# Patient Record
Sex: Male | Born: 1995 | Race: Black or African American | Hispanic: No | Marital: Single | Smoking: Never smoker
Health system: Southern US, Community
[De-identification: ages and names within clinical notes are randomized; demographics above are authoritative.]

## PROBLEM LIST (undated history)

## (undated) DIAGNOSIS — K219 Gastro-esophageal reflux disease without esophagitis: Secondary | ICD-10-CM

## (undated) DIAGNOSIS — F419 Anxiety disorder, unspecified: Secondary | ICD-10-CM

## (undated) HISTORY — DX: Anxiety disorder, unspecified: F41.9

## (undated) HISTORY — PX: OTHER SURGICAL HISTORY: SHX169

## (undated) HISTORY — DX: Gastro-esophageal reflux disease without esophagitis: K21.9

---

## 2018-09-03 ENCOUNTER — Encounter: Payer: Self-pay | Admitting: General Surgery

## 2018-09-07 ENCOUNTER — Encounter: Payer: Self-pay | Admitting: Gastroenterology

## 2018-09-07 ENCOUNTER — Other Ambulatory Visit: Payer: Self-pay

## 2018-09-07 ENCOUNTER — Ambulatory Visit (INDEPENDENT_AMBULATORY_CARE_PROVIDER_SITE_OTHER): Payer: Self-pay | Admitting: Gastroenterology

## 2018-09-07 VITALS — Ht 75.0 in | Wt 250.0 lb

## 2018-09-07 DIAGNOSIS — K219 Gastro-esophageal reflux disease without esophagitis: Secondary | ICD-10-CM

## 2018-09-07 DIAGNOSIS — R1033 Periumbilical pain: Secondary | ICD-10-CM

## 2018-09-07 NOTE — Progress Notes (Signed)
Virtual Visit via Video Note  I connected with Bernard Arnold on 09/07/18 at  2:30 PM EDT by a video enabled telemedicine application and verified that I am speaking with the correct person using two identifiers.  I discussed the limitations of evaluation and management by telemedicine and the availability of in person appointments. The patient expressed understanding and agreed to proceed.  THIS ENCOUNTER IS A VIRTUAL VISIT DUE TO COVID-19 - PATIENT WAS NOT SEEN IN THE OFFICE. PATIENT HAS CONSENTED TO VIRTUAL VISIT / TELEMEDICINE VISIT USING FACETIME APP   Location of patient: home Location of provider: office Name of referring provider: self referred Persons participating: myself, patient   HPI :  23 y/o male with a history of anxiety and GERD, here for a new patient visit for abdominal pain.  He reports abdominal pain going on for roughly 1-2 weeks. He feels it around the umbilicus but just above it. He will feel it when he lies down and stretches his abdomen, it will reliably produce some discomfort there. If he is not stretching his abdomen or moving, he will not feel it. He does not notice it at all during the day, able to work and function without experiencing it. He doesn't think he pulled it nor aggravated it by lifting something or working. He won't feel it if not stretching. He is eating okay. No nausea or vomiting. Eating doesn't make this worse at all. He denies any constipation, does have occasional loose stools. He denies any blood in the stools.   No prior surgeries. No FH of gastric cancer or colon cancer.  He does Vape. He drinks alcohol about 5 days per week, he thinks 1-2 drinks at a time.Marland Kitchen.  He takes protonix for heartburn / reflux, works well, 20mg  once daily, he denies any breakthrough. No dysphagia. He has a good appetite, no weight loss.   He lives in [place], has had some labs done there by his primary care recently, states they were normal but do  not have any records available.  Past Medical History:  Diagnosis Date  . Anxiety   . GERD (gastroesophageal reflux disease)      History reviewed. No pertinent surgical history. Family History  Problem Relation Age of Onset  . Breast cancer Mother    Social History   Tobacco Use  . Smoking status: Never Smoker  . Smokeless tobacco: Never Used  Substance Use Topics  . Alcohol use: Yes    Alcohol/week: 2.0 standard drinks    Types: 2 Cans of beer per week    Comment: 5 days a week  . Drug use: Not Currently   Current Outpatient Medications  Medication Sig Dispense Refill  . pantoprazole (PROTONIX) 20 MG tablet Take 20 mg by mouth daily.     No current facility-administered medications for this visit.    No Known Allergies   Review of Systems: All systems reviewed and negative except where noted in HPI.    No results found.  Physical Exam: Ht 6\' 3"  (1.905 m) Comment: pt provided over the phone  Wt 250 lb (113.4 kg) Comment: pt provided over the phone  BMI 31.25 kg/m   NA   ASSESSMENT AND PLAN: 23 y/o male here for a new patient assessment of the following:  Abdominal pain - this is positional, only brought on by stretching his abdomen, otherwise does not bother him or limit his functioning, and has only been going on 1-2 weeks. I explained this visit is  somewhat limited in that I cannot examine him, but based on his history this seems most likely to be musculoskeletal, based on how he is describing his symptoms right now. He can try some ibuprofen to use PRN and see if that helps. I suspect this may get better with time, and he should avoid doing abdominal exercises right now. If symptoms persist moving forward or bothers him outside of stretching, etc, he should contact me for further assessment. He agreed.  GERD - low dose protonix working well for him as needed, he can continue this regimen and follow up PRN. No alarm symptoms.   Ileene Patrick, MD [facility]  Gastroenterology

## 2018-09-07 NOTE — Progress Notes (Signed)
Pt prescreened by Tracy Amaral for 5-4 appt 

## 2019-03-14 ENCOUNTER — Other Ambulatory Visit: Payer: Self-pay

## 2019-03-14 ENCOUNTER — Encounter (HOSPITAL_COMMUNITY): Payer: Self-pay

## 2019-03-14 ENCOUNTER — Emergency Department (HOSPITAL_COMMUNITY)
Admission: EM | Admit: 2019-03-14 | Discharge: 2019-03-14 | Disposition: A | Discharge status: Home / Self Care | Payer: BC Managed Care – PPO | Attending: Emergency Medicine | Admitting: Emergency Medicine

## 2019-03-14 DIAGNOSIS — Z20828 Contact with and (suspected) exposure to other viral communicable diseases: Secondary | ICD-10-CM | POA: Diagnosis not present

## 2019-03-14 DIAGNOSIS — R509 Fever, unspecified: Secondary | ICD-10-CM | POA: Diagnosis present

## 2019-03-14 DIAGNOSIS — Z79899 Other long term (current) drug therapy: Secondary | ICD-10-CM | POA: Diagnosis not present

## 2019-03-14 DIAGNOSIS — Z20822 Contact with and (suspected) exposure to covid-19: Secondary | ICD-10-CM

## 2019-03-14 LAB — SARS CORONAVIRUS 2 (TAT 6-24 HRS): SARS Coronavirus 2: NEGATIVE

## 2019-03-14 NOTE — ED Triage Notes (Signed)
Patient complains of fever last night with reported nausea, no other associated symptoms. Requesting covid test

## 2019-03-14 NOTE — ED Notes (Signed)
Patient verbalizes understanding of discharge instructions . Opportunity for questions and answers were provided . Armband removed by staff ,Pt discharged from ED. W/C  offered at D/C  and Declined W/C at D/C and was escorted to lobby by RN.  

## 2019-03-14 NOTE — Discharge Instructions (Signed)
Thank you for allowing [place] to care for you today.   Please return to the emergency department if you have any new or worsening symptoms.  You were tested today for coronavirus.  The test result should be available in 24 hours.  If it is positive you will receive a phone call.  If the result is negative you will be able to see it in your MyChart.   If positive please follow the instructions below:  Medications- You can take medications to help treat your symptoms: -Tylenol for fever and body aches. Please take as prescribed on the bottle. -Over the coutner cough medicine such as mucinex, robitussin, or other brands.  Treatment- This is a virus and unfortunately there are no antibitotics approved to treat this virus at this time. It is important to monitor your symptoms closely: -You should have a theremometer at home to check your temperature when feeling feverish. -Use a pulse ox meter to measure your oxygen when feeling short of breath.  -If your fever is over 100.4 despite taking tylenol or if your oxygen level drops below 94% these are reasons to rturn to the emergency department for further evaluation. Please call the emergency department before you come to make [place] aware.    We recommend you self-isolate for 10 days and to inform your work/family/friends that you has the virus.  They will need to self-quarantine for 14 days to monitor for symptoms.    Again: symptoms of shortnessf breath, chest pain, difficulty breathing, new onset of confuison, any symptoms that are concerning. And you or the person should come to emergency department for evaluation.   I hope you feel better soon

## 2019-03-14 NOTE — ED Provider Notes (Signed)
[facility] EMERGENCY DEPARTMENT Provider Note   CSN: [id number] Arrival date & time: 03/14/19  1541     History   Chief Complaint Chief Complaint  Patient presents with  . Fever/ nausea    HPI Bernard Arnold is a 23 y.o. male with past medical history significant for anxiety and GERD presents to emergency room today with chief complaint of requesting Covid test.  Patient states he was at work yesterday when he said he did not feel well and his boss that he needed to get tested.  He said he checked his temperature yesterday and it was 99.0.  He had an episode of nausea without emesis.  He denies any other symptoms.  He was recently in [place].  He states he was not on the "party scene." and did his best to wash his hands and wear his mask. He has not taken any medications for symptoms prior to arrival.  He denies cough, congestion, sore throat, chest pain, shortness of breath, abdominal pain, nausea, vomiting, urinary frequency, diarrhea.    Past Medical History:  Diagnosis Date  . Anxiety   . GERD (gastroesophageal reflux disease)     There are no active problems to display for this patient.   History reviewed. No pertinent surgical history.      Home Medications    Prior to Admission medications   Medication Sig Start Date End Date Taking? Authorizing Provider  pantoprazole (PROTONIX) 20 MG tablet Take 20 mg by mouth daily.    [provider]    Family History Family History  Problem Relation Age of Onset  . Breast cancer Mother     Social History Social History   Tobacco Use  . Smoking status: Never Smoker  . Smokeless tobacco: Never Used  Substance Use Topics  . Alcohol use: Yes    Alcohol/week: 2.0 standard drinks    Types: 2 Cans of beer per week    Comment: 5 days a week  . Drug use: Not Currently     Allergies   Patient has no known allergies.   Review of Systems Review of Systems  Constitutional: Positive for  fever.  Gastrointestinal: Positive for nausea.  All other systems reviewed and are negative.    Physical Exam Updated Vital Signs BP (!) 131/94 (BP Location: Left Arm)   Pulse 89   Temp 98.5 F (36.9 C) (Oral)   Resp 16   SpO2 99%    Physical Exam Vitals signs and nursing note reviewed.  Constitutional:      Appearance: He is well-developed. He is not ill-appearing or toxic-appearing.  HENT:     Head: Normocephalic and atraumatic.     Nose: Nose normal.  Eyes:     General: No scleral icterus.       Right eye: No discharge.        Left eye: No discharge.     Conjunctiva/sclera: Conjunctivae normal.  Neck:     Musculoskeletal: Normal range of motion.     Vascular: No JVD.  Cardiovascular:     Rate and Rhythm: Normal rate and regular rhythm.     Pulses: Normal pulses.     Heart sounds: Normal heart sounds.  Pulmonary:     Effort: Pulmonary effort is normal.     Breath sounds: Normal breath sounds.     Comments: Lungs are clear to auscultation all fields.  Normal work of breathing.  No wheezing, rhonchi or rales heard. Abdominal:  General: Bowel sounds are normal. There is no distension.     Palpations: Abdomen is soft.     Tenderness: There is no right CVA tenderness or left CVA tenderness.     Comments: Abdomen is soft and nontender.  No peritoneal signs.  No guarding or rigidity.   Musculoskeletal: Normal range of motion.  Skin:    General: Skin is warm and dry.  Neurological:     Mental Status: He is oriented to person, place, and time.     GCS: GCS eye subscore is 4. GCS verbal subscore is 5. GCS motor subscore is 6.     Comments: Fluent speech, no facial droop.  Psychiatric:        Behavior: Behavior normal.      ED Treatments / Results  Labs (all labs ordered are listed, but only abnormal results are displayed) Labs Reviewed  SARS CORONAVIRUS 2 (TAT 6-24 HRS)    EKG None  Radiology No results found.  Procedures Procedures (including critical  care time)  Medications Ordered in ED Medications - No data to display   Initial Impression / Assessment and Plan / ED Course  I have reviewed the triage vital signs and the nursing notes.  Pertinent labs & imaging results that were available during my care of the patient were reviewed by me and considered in my medical decision making (see chart for details).    I have reviewed patient's EMR to obtain pertinent PMH to assist in MDM.  Patient is well-appearing, no acute distress.  Vitals are within normal limits.  Symptoms and exam most suggestive of uncomplicated viral illness. DDX incluldes viral URI/LRI, COVID-19.  No travel. No known exposures to confirmed COVID-19.   Exam is benign.  Normal WOB. No fever, tachypnea, tachycardia, hypoxemia. Lungs are CTAB. I do not think that a CXR is indicated at this time as VS are WNL, there are no signs of consolidation on auscultation and there is no hypoxia, increased WOB or other concerning features to exam. No significant h/o immunocompromise. Doubt bacterial bronchitis or pneumonia.  No signs or symptoms to suggest strep pharyngitis.  No clinical signs of severe illness, dehydration, to warrant further emergent work up in ER.  Given reassuring physical exam, symptoms, will discharge with symptomatic treatment. Send out Covid test performed. Self-isolation instructions discussed. Pt was given home self-isolation instructions and instructions for family members.  Pt understands signs and symptoms that would warrant return to ED.  Pt comfortable and agreeable with POC.   Bernard Arnold was evaluated in Emergency Department on 03/14/2019 for the symptoms described in the history of present illness. He was evaluated in the context of the global COVID-19 pandemic, which necessitated consideration that the patient might be at risk for infection with the SARS-CoV-2 virus that causes COVID-19. Institutional protocols and algorithms that pertain to the  evaluation of patients at risk for COVID-19 are in a state of rapid change based on information released by regulatory bodies including the CDC and federal and state organizations. These policies and algorithms were followed during the patient's care in the ED.   Final Clinical Impressions(s) / ED Diagnoses   Final diagnoses:  Exposure to COVID-19 virus    ED Discharge Orders    None       Cherre Robins, PA-C 03/14/19 1933    Sherwood Gambler, MD 03/14/19 2344

## 2019-08-30 ENCOUNTER — Other Ambulatory Visit: Payer: Self-pay

## 2019-08-30 ENCOUNTER — Telehealth: Payer: Self-pay | Admitting: Gastroenterology

## 2019-08-30 MED ORDER — PANTOPRAZOLE SODIUM 20 MG PO TBEC: 20.0000 mg | DELAYED_RELEASE_TABLET | Freq: Every day | ORAL | 2 refills | Status: AC

## 2019-08-30 NOTE — Telephone Encounter (Signed)
Thank you Spoke with the patient. He would prefer to have his own prescription of Protonix. Agrees to give this a couple of weeks. He will call with an update. He will call sooner if he acutely worsens with the understanding he will need to be seen in office.

## 2019-08-30 NOTE — Telephone Encounter (Signed)
Thanks [employer]. I agree he should resume PPI. It can take a day for him to notice the difference after starting it. If he has omeprazole 40mg  / day he can use that for the next few weeks. If he wants to resume the protonix he has otherwise he can take that, or we can refill it for him, whichever his preference. If symptoms not improved on daily PPI after a few weeks he can call back. We can make him a routine office follow up for this if he wishes as well.

## 2019-08-30 NOTE — Telephone Encounter (Signed)
Patient calls with complaints of reflux and acid stomach for "a while now." He has tried extra strength TUMS for his symptoms with limited relief. His symptoms start in the mornings. Careful with diet as much as he can. Recently moved to [place] from [place]. Previously on Protonix 20mg . He has not taken that is at least 2 months. He took his grandmother's Omeprazole 40 mg today. He is not sure if it is helping. No different right now. Last office visit was virtual 09/07/2018. Please advise. Thanks.

## 2019-09-30 ENCOUNTER — Ambulatory Visit (INDEPENDENT_AMBULATORY_CARE_PROVIDER_SITE_OTHER): Payer: BC Managed Care – PPO | Admitting: Gastroenterology

## 2019-09-30 ENCOUNTER — Encounter: Payer: Self-pay | Admitting: Gastroenterology

## 2019-09-30 ENCOUNTER — Other Ambulatory Visit (INDEPENDENT_AMBULATORY_CARE_PROVIDER_SITE_OTHER): Payer: BC Managed Care – PPO

## 2019-09-30 VITALS — BP 104/78 | HR 84 | Ht 75.0 in | Wt 245.8 lb

## 2019-09-30 DIAGNOSIS — R1013 Epigastric pain: Secondary | ICD-10-CM

## 2019-09-30 DIAGNOSIS — K219 Gastro-esophageal reflux disease without esophagitis: Secondary | ICD-10-CM | POA: Diagnosis not present

## 2019-09-30 LAB — H. PYLORI ANTIBODY, IGG: H Pylori IgG: NEGATIVE

## 2019-09-30 NOTE — Progress Notes (Signed)
HPI :  24 year old male here for a follow-up visit for GERD and and epigastric discomfort.  He has had a history of reflux in the past, has used Protonix in the past as well as omeprazole and states it typically helps if he takes it.  He inquires and has questions about his reflux.  He does have waterbrash, has acid taste in the mouth when he wakes up.  He has occasional pyrosis but not too much.  He does have epigastric discomfort that comes and goes.  Denies routine postprandial symptoms, perhaps at times.  His stomach bothers him periodically most days of the week.  He denies any nausea or vomiting.  He is really well.  No weight loss.  No regurgitation.  He has some belching occasionally.  No significant or routine dysphagia.  His bowels are regular for the most part.  No blood in his stools.  He ran out of PPI and we gave him a trial of Protonix and states it does help when he takes it but he does not take it every day.  He denies any NSAIDs.  When he takes Tums it can help some of his symptoms as well.  He has questions about long-term management of this.  No FH of gastric cancer or colon cancer.    Past Medical History:  Diagnosis Date  . Anxiety   . GERD (gastroesophageal reflux disease)      Past Surgical History:  Procedure Laterality Date  . none     Family History  Problem Relation Age of Onset  . Breast cancer Mother   . Colon cancer Neg Hx   . Esophageal cancer Neg Hx    Social History   Tobacco Use  . Smoking status: Never Smoker  . Smokeless tobacco: Never Used  Substance Use Topics  . Alcohol use: Yes    Comment: 2-3 pints liquior per week   . Drug use: Not Currently   Current Outpatient Medications  Medication Sig Dispense Refill  . pantoprazole (PROTONIX) 20 MG tablet Take 1 tablet (20 mg total) by mouth daily before breakfast. 30 tablet 2   No current facility-administered medications for this visit.   No Known Allergies   Review of Systems: All  systems reviewed and negative except where noted in HPI.     Physical Exam: BP 104/78   Pulse 84   Ht 6\' 3"  (1.905 m)   Wt 245 lb 12.8 oz (111.5 kg)   BMI 30.72 kg/m  Constitutional: Pleasant,well-developed, male in no acute distress. Abdominal: Soft, nondistended, nontender. There are no masses palpable. No hepatomegaly. Extremities: no edema Lymphadenopathy: No cervical adenopathy noted. Neurological: Alert and oriented to person place and time. Skin: Skin is warm and dry. No rashes noted. Psychiatric: Normal mood and affect. Behavior is normal.   ASSESSMENT AND PLAN: 24 year old male here for reassessment of the following issues:  GERD / epigastric discomfort - he does have some reliable improvement with antacids, I think GERD, with possible mild esophagitis is the likely cause, although dyspepsia also possible vs. PUD / gastritis.  I will screen for H. pylori with serology to ensure negative, but otherwise discussed reflux and treatment options.  Has not been taking PPI routinely and has questions about this.  I recommend taking 20 mg Protonix once to twice daily every day for the next 4 to 6 weeks to try to get good control of symptoms and he can wean down to take as needed. He should take it  30 min prior to a meal. If this works well for him, I do not think we need to pursue any further evaluation.  If he takes this course and notices no improvement, asked him to call me back and we can discuss other options.  He agreed with the plan, if H. pylori serology is positive we will plan on treating him for that.   Ileene Patrick, MD [facility] Gastroenterology

## 2019-09-30 NOTE — Patient Instructions (Signed)
Your provider has requested that you go to the basement level for lab work before leaving today. Press "B" on the elevator. The lab is located at the first door on the left as you exit the elevator.  We have sent the following medications to your pharmacy for you to pick up at your convenience: Pantoprazole 20 mg- Take 1-2 tablets daily x 4-6 weeks, then take as needed.  If you are no better after taking pantoprazole as directed, please call our office at [phone].  If you are age 70 or older, your body mass index should be between 23-30. Your Body mass index is 30.72 kg/m. If this is out of the aforementioned range listed, please consider follow up with your Primary Care Provider.  If you are age 26 or younger, your body mass index should be between 19-25. Your Body mass index is 30.72 kg/m. If this is out of the aformentioned range listed, please consider follow up with your Primary Care Provider.   Due to recent changes in healthcare laws, you may see the results of your imaging and laboratory studies on MyChart before your provider has had a chance to review them.  We understand that in some cases there may be results that are confusing or concerning to you. Not all laboratory results come back in the same time frame and the provider may be waiting for multiple results in order to interpret others.  Please give [place] 48 hours in order for your provider to thoroughly review all the results before contacting the office for clarification of your results.

## 2019-11-21 ENCOUNTER — Other Ambulatory Visit: Payer: Self-pay

## 2019-11-21 ENCOUNTER — Encounter (HOSPITAL_COMMUNITY): Payer: Self-pay | Admitting: Emergency Medicine

## 2019-11-21 ENCOUNTER — Emergency Department (HOSPITAL_COMMUNITY): Payer: BC Managed Care – PPO

## 2019-11-21 ENCOUNTER — Emergency Department (HOSPITAL_COMMUNITY)
Admission: EM | Admit: 2019-11-21 | Discharge: 2019-11-21 | Disposition: A | Discharge status: Home / Self Care | Payer: BC Managed Care – PPO | Attending: Emergency Medicine | Admitting: Emergency Medicine

## 2019-11-21 DIAGNOSIS — R0789 Other chest pain: Secondary | ICD-10-CM | POA: Insufficient documentation

## 2019-11-21 LAB — BASIC METABOLIC PANEL
Anion gap: 10 (ref 5–15)
BUN: 5 mg/dL — ABNORMAL LOW (ref 6–20)
CO2: 25 mmol/L (ref 22–32)
Calcium: 9.2 mg/dL (ref 8.9–10.3)
Chloride: 105 mmol/L (ref 98–111)
Creatinine, Ser: 0.79 mg/dL (ref 0.61–1.24)
GFR calc Af Amer: >60 mL/min (ref >60)
GFR calc non Af Amer: >60 mL/min (ref >60)
Glucose, Bld: 116 mg/dL — ABNORMAL HIGH (ref 70–99)
Potassium: 4 mmol/L (ref 3.5–5.1)
Sodium: 140 mmol/L (ref 135–145)

## 2019-11-21 LAB — CBC
HCT: 46 % (ref 39.0–52.0)
Hemoglobin: 15.3 g/dL (ref 13.0–17.0)
MCH: 29 pg (ref 26.0–34.0)
MCHC: 33.3 g/dL (ref 30.0–36.0)
MCV: 87.3 fL (ref 80.0–100.0)
Platelets: 223 10*3/uL (ref 150–400)
RBC: 5.27 MIL/uL (ref 4.22–5.81)
RDW: 12.7 % (ref 11.5–15.5)
WBC: 6.2 10*3/uL (ref 4.0–10.5)
nRBC: 0 % (ref 0.0–0.2)

## 2019-11-21 LAB — TROPONIN I (HIGH SENSITIVITY)
Troponin I (High Sensitivity): 3 ng/L (ref <18)
Troponin I (High Sensitivity): <2 ng/L (ref <18)

## 2019-11-21 MED ORDER — SODIUM CHLORIDE 0.9% FLUSH: 3.0000 mL | Freq: Once | INTRAVENOUS | Status: DC

## 2019-11-21 MED ORDER — KETOROLAC TROMETHAMINE 60 MG/2ML IM SOLN
60.0000 mg | Freq: Once | INTRAMUSCULAR | Status: AC
  Administered 2019-11-21: 60 mg via INTRAMUSCULAR
  Filled 2019-11-21: qty 2

## 2019-11-21 NOTE — ED Triage Notes (Signed)
C/o R sided chest pain since 3am.  States pain is worse when he sits up from lying position.  Denies SOB, nausea, and vomiting.

## 2019-11-21 NOTE — Discharge Instructions (Addendum)
Ibuprofen 600 mg 3 times daily for the next 5 days.  Rest.  Follow-up with your primary doctor if not improving in the next week, and return to the ER if you develop worsening pain, high fever, difficulty breathing, or other new and concerning symptoms.

## 2019-11-21 NOTE — ED Provider Notes (Signed)
[facility] EMERGENCY DEPARTMENT Provider Note   CSN: [id number] Arrival date & time: 11/21/19  1533     History Chief Complaint  Patient presents with  . Chest Pain    [facility] is a 24 y.o. male.  Patient is a 24 year old male with history of anxiety and GERD.  He presents today for evaluation of chest discomfort.  Sleep at 3 AM today and noticed that this feeling was there.  He describes a sharp pain to the upper sternum that is worse when he lies flat, changes position, and breathes deeply.  He denies any shortness of breath, nausea, or diaphoresis.  Patient has no prior cardiac history and has no cardiac risk factors.  He denies any recent exertional symptoms.  The history is provided by the patient.  Chest Pain Pain location:  Substernal area Pain quality: sharp   Pain radiates to:  Does not radiate Pain severity:  Moderate Onset quality:  Sudden Duration:  12 hours Timing:  Constant Progression:  Unchanged Chronicity:  New Context: breathing, movement and raising an arm   Relieved by:  Nothing Ineffective treatments:  None tried      Past Medical History:  Diagnosis Date  . Anxiety   . GERD (gastroesophageal reflux disease)     There are no problems to display for this patient.   Past Surgical History:  Procedure Laterality Date  . none         Family History  Problem Relation Age of Onset  . Breast cancer Mother   . Colon cancer Neg Hx   . Esophageal cancer Neg Hx     Social History   Tobacco Use  . Smoking status: Never Smoker  . Smokeless tobacco: Never Used  Vaping Use  . Vaping Use: Some days  Substance Use Topics  . Alcohol use: Yes    Comment: 2-3 pints liquior per week   . Drug use: Not Currently    Home Medications Prior to Admission medications   Medication Sig Start Date End Date Taking? Authorizing Provider  pantoprazole (PROTONIX) 20 MG tablet Take 1 tablet (20 mg total) by mouth daily before  breakfast. 08/30/19   Armbruster, Willaim Rayas, MD    Allergies    Patient has no known allergies.  Review of Systems   Review of Systems  Cardiovascular: Positive for chest pain.  All other systems reviewed and are negative.   Physical Exam Updated Vital Signs BP (!) 132/95   Pulse 76   Temp 99 F (37.2 C) (Oral)   Resp 18   Ht 6\' 3"  (1.905 m)   Wt 113.4 kg   SpO2 100%   BMI 31.25 kg/m   Physical Exam Vitals and nursing note reviewed.  Constitutional:      General: He is not in acute distress.    Appearance: He is well-developed. He is not diaphoretic.  HENT:     Head: Normocephalic and atraumatic.  Cardiovascular:     Rate and Rhythm: Normal rate and regular rhythm.     Heart sounds: No murmur heard.  No friction rub.  Pulmonary:     Effort: Pulmonary effort is normal. No respiratory distress.     Breath sounds: Normal breath sounds. No wheezing or rales.  Abdominal:     General: Bowel sounds are normal. There is no distension.     Palpations: Abdomen is soft.     Tenderness: There is no abdominal tenderness.  Musculoskeletal:  General: Normal range of motion.     Cervical back: Normal range of motion and neck supple.     Right lower leg: No tenderness. No edema.     Left lower leg: No tenderness. No edema.  Skin:    General: Skin is warm and dry.  Neurological:     Mental Status: He is alert and oriented to person, place, and time.     Coordination: Coordination normal.     ED Results / Procedures / Treatments   Labs (all labs ordered are listed, but only abnormal results are displayed) Labs Reviewed  BASIC METABOLIC PANEL - Abnormal; Notable for the following components:      Result Value   Glucose, Bld 116 (*)    BUN 5 (*)    All other components within normal limits  CBC  TROPONIN I (HIGH SENSITIVITY)  TROPONIN I (HIGH SENSITIVITY)    EKG EKG Interpretation  Date/Time:  Sunday November 21 2019 15:56:46 EDT Ventricular Rate:  85 PR  Interval:  154 QRS Duration: 88 QT Interval:  364 QTC Calculation: 433 R Axis:   87 Text Interpretation: Normal sinus rhythm Normal ECG Confirmed by Geoffery Lyons ([id number]) on 11/21/2019 7:45:33 PM   Radiology DG Chest 2 View  Result Date: 11/21/2019 CLINICAL DATA:  C/o R sided chest pain since 3am. States pain is worse when he sits up from lying position. Denies SOB, nausea, and vomiting. EXAM: CHEST - 2 VIEW COMPARISON:  None. FINDINGS: The heart size and mediastinal contours are within normal limits. The lungs are clear. No pneumothorax or pleural effusion. The visualized skeletal structures are unremarkable. IMPRESSION: No active cardiopulmonary disease. Electronically Signed   By: Emmaline Kluver M.D.   On: 11/21/2019 16:38    Procedures Procedures (including critical care time)  Medications Ordered in ED Medications  sodium chloride flush (NS) 0.9 % injection 3 mL (has no administration in time range)  ketorolac (TORADOL) injection 60 mg (has no administration in time range)    ED Course  I have reviewed the triage vital signs and the nursing notes.  Pertinent labs & imaging results that were available during my care of the patient were reviewed by me and considered in my medical decision making (see chart for details).    MDM Rules/Calculators/A&P  Patient presenting here with complaints of pain to the upper chest that was present when he woke at 3 AM.  His pain is worse with palpation, movement, and raising his arms.  Patient's vital signs are stable and his work-up is unremarkable.  His EKG is normal and troponin is negative.  Chest x-ray is clear.  There is no hypoxia and no leg swelling or tenderness.  I feel as though the possibility of a cardiac etiology is exceedingly small.  I also highly doubt an emergent cause such as pulmonary embolism or aortic dissection.    His symptoms and exam are most consistent with a musculoskeletal etiology.  He will be given IM Toradol  and discharged with a regular dose of anti-inflammatories for the next 3 days.  He is to return if he worsens and follow-up if not improving.  Final Clinical Impression(s) / ED Diagnoses Final diagnoses:  None    Rx / DC Orders ED Discharge Orders    None       Geoffery Lyons, MD 11/21/19 2009

## 2020-10-24 ENCOUNTER — Encounter (HOSPITAL_COMMUNITY): Payer: Self-pay | Admitting: Emergency Medicine

## 2020-10-24 ENCOUNTER — Ambulatory Visit (HOSPITAL_COMMUNITY)
Admission: EM | Admit: 2020-10-24 | Discharge: 2020-10-24 | Disposition: A | Discharge status: Home / Self Care | Payer: No Typology Code available for payment source | Attending: Urgent Care | Admitting: Urgent Care

## 2020-10-24 ENCOUNTER — Other Ambulatory Visit: Payer: Self-pay

## 2020-10-24 DIAGNOSIS — Z20822 Contact with and (suspected) exposure to covid-19: Secondary | ICD-10-CM | POA: Insufficient documentation

## 2020-10-24 DIAGNOSIS — J069 Acute upper respiratory infection, unspecified: Secondary | ICD-10-CM | POA: Diagnosis not present

## 2020-10-24 DIAGNOSIS — J029 Acute pharyngitis, unspecified: Secondary | ICD-10-CM

## 2020-10-24 DIAGNOSIS — F17290 Nicotine dependence, other tobacco product, uncomplicated: Secondary | ICD-10-CM | POA: Diagnosis not present

## 2020-10-24 DIAGNOSIS — R059 Cough, unspecified: Secondary | ICD-10-CM | POA: Insufficient documentation

## 2020-10-24 LAB — POCT RAPID STREP A, ED / UC: Streptococcus, Group A Screen (Direct): NEGATIVE

## 2020-10-24 LAB — SARS CORONAVIRUS 2 (TAT 6-24 HRS): SARS Coronavirus 2: NEGATIVE

## 2020-10-24 MED ORDER — PROMETHAZINE-DM 6.25-15 MG/5ML PO SYRP
5.0000 mL | ORAL_SOLUTION | Freq: Every evening | ORAL | 0 refills | Status: AC | PRN
Start: 2020-10-24 — End: ?

## 2020-10-24 MED ORDER — BENZONATATE 100 MG PO CAPS: 100.0000 mg | ORAL_CAPSULE | Freq: Three times a day (TID) | ORAL | 0 refills | Status: AC | PRN

## 2020-10-24 MED ORDER — CETIRIZINE HCL 10 MG PO TABS: 10.0000 mg | ORAL_TABLET | Freq: Every day | ORAL | 0 refills | Status: AC

## 2020-10-24 MED ORDER — PSEUDOEPHEDRINE HCL 60 MG PO TABS: 60.0000 mg | ORAL_TABLET | Freq: Three times a day (TID) | ORAL | 0 refills | Status: AC | PRN

## 2020-10-24 NOTE — ED Triage Notes (Signed)
Sore throat for 2 days, cough, difficulty swallowing, nausea, body aches, particularly joints and night sweats

## 2020-10-24 NOTE — Discharge Instructions (Signed)

## 2020-10-24 NOTE — ED Provider Notes (Signed)
[facility]   MRN: [id number] DOB: Jan 05, 1996  Subjective:   [facility] Arnold is a 25 y.o. male presenting for 2-day history of acute onset cough, painful swallowing, bilateral ear pain, nausea without vomiting, body aches, cold sweats and chills.  Patient is not vaccinated against COVID-19 and wants to make sure he does not have this.  Denies smoking history, history of asthma, respiratory disorders.  Denies chest pain, shortness of breath, fevers.  No current facility-administered medications for this encounter.  Current Outpatient Medications:    pantoprazole (PROTONIX) 20 MG tablet, Take 1 tablet (20 mg total) by mouth daily before breakfast. (Patient not taking: Reported on 10/24/2020), Disp: 30 tablet, Rfl: 2   No Known Allergies  Past Medical History:  Diagnosis Date   Anxiety    GERD (gastroesophageal reflux disease)      Past Surgical History:  Procedure Laterality Date   none      Family History  Problem Relation Age of Onset   Breast cancer Mother    Colon cancer Neg Hx    Esophageal cancer Neg Hx     Social History   Tobacco Use   Smoking status: Never   Smokeless tobacco: Never  Vaping Use   Vaping Use: Some days  Substance Use Topics   Alcohol use: Yes    Comment: 2-3 pints liquior per week    Drug use: Not Currently    ROS   Objective:   Vitals: BP (!) 145/93 (BP Location: Left Arm) Comment (BP Location): large cuff  Pulse 90   Temp 98.3 F (36.8 C) (Oral)   Resp 20   SpO2 97%   Physical Exam Constitutional:      General: He is not in acute distress.    Appearance: Normal appearance. He is well-developed and normal weight. He is not ill-appearing, toxic-appearing or diaphoretic.  HENT:     Head: Normocephalic and atraumatic.     Right Ear: Tympanic membrane, ear canal and external ear normal. No drainage, swelling or tenderness. No middle ear effusion. There is no impacted cerumen. Tympanic membrane is not  erythematous.     Left Ear: Tympanic membrane, ear canal and external ear normal. No drainage, swelling or tenderness.  No middle ear effusion. There is no impacted cerumen. Tympanic membrane is not erythematous.     Nose: Nose normal. No congestion or rhinorrhea.     Comments: Nasal mucosa boggy and edematous.    Mouth/Throat:     Mouth: Mucous membranes are moist.     Pharynx: No oropharyngeal exudate or posterior oropharyngeal erythema.  Eyes:     General: No scleral icterus.       Right eye: No discharge.        Left eye: No discharge.     Extraocular Movements: Extraocular movements intact.     Conjunctiva/sclera: Conjunctivae normal.     Pupils: Pupils are equal, round, and reactive to light.  Cardiovascular:     Rate and Rhythm: Normal rate and regular rhythm.     Heart sounds: Normal heart sounds. No murmur heard.   No friction rub. No gallop.  Pulmonary:     Effort: Pulmonary effort is normal. No respiratory distress.     Breath sounds: Normal breath sounds. No stridor. No wheezing, rhonchi or rales.  Musculoskeletal:     Cervical back: Normal range of motion and neck supple. No rigidity. No muscular tenderness.  Neurological:     General: No focal deficit present.  Mental Status: He is alert and oriented to person, place, and time.  Psychiatric:        Mood and Affect: Mood normal.        Behavior: Behavior normal.        Thought Content: Thought content normal.    Results for orders placed or performed during the hospital encounter of 10/24/20 (from the past 24 hour(s))  POCT Rapid Strep A     Status: None   Collection Time: 10/24/20 12:21 PM  Result Value Ref Range   Streptococcus, Group A Screen (Direct) NEGATIVE NEGATIVE    Assessment and Plan :   PDMP not reviewed this encounter.  1. Viral URI with cough   2. Sore throat     Will manage for viral illness such as viral URI, viral syndrome, viral rhinitis, COVID-19. Counseled patient on nature of  COVID-19 including modes of transmission, diagnostic testing, management and supportive care.  Offered scripts for symptomatic relief. COVID 19 test and strep culture are pending. Counseled patient on potential for adverse effects with medications prescribed/recommended today, ER and return-to-clinic precautions discussed, patient verbalized understanding.     Wallis Bamberg, PA-C 10/24/20 1259

## 2020-10-26 LAB — CULTURE, GROUP A STREP (THRC)

## 2020-10-27 ENCOUNTER — Telehealth (HOSPITAL_COMMUNITY): Payer: Self-pay | Admitting: Emergency Medicine

## 2020-10-27 MED ORDER — PENICILLIN V POTASSIUM 500 MG PO TABS
500.0000 mg | ORAL_TABLET | Freq: Two times a day (BID) | ORAL | 0 refills | Status: AC
Start: 2020-10-27 — End: 2020-11-06

## 2022-05-26 IMAGING — DX DG CHEST 2V
2 series · 2 of 2 positions shown · non-contrast
Comparison: None.

CLINICAL DATA: C/o R sided chest pain since 3am. States pain is
worse when he sits up from lying position. Denies SOB, nausea, and
vomiting.

EXAM:
CHEST - 2 VIEW

[chest pa]
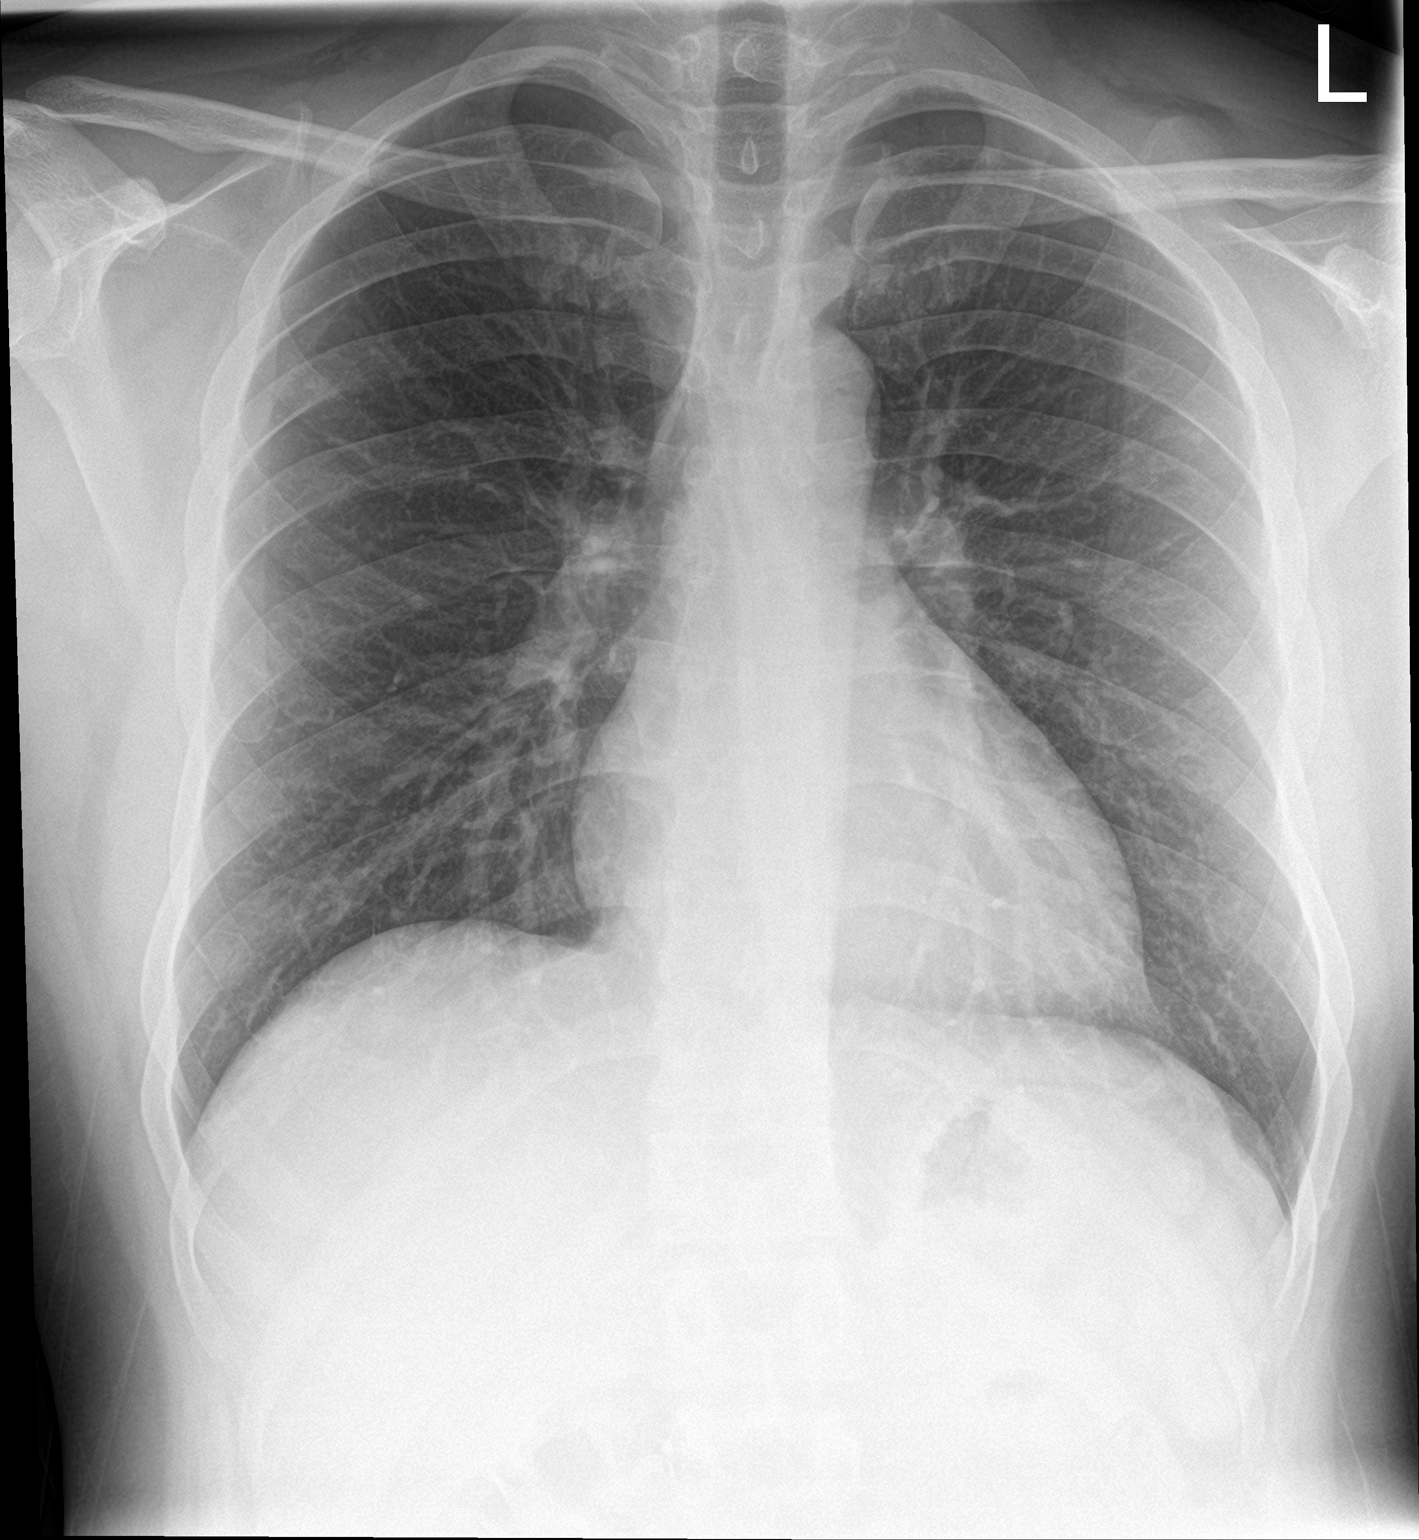

[chest lat]
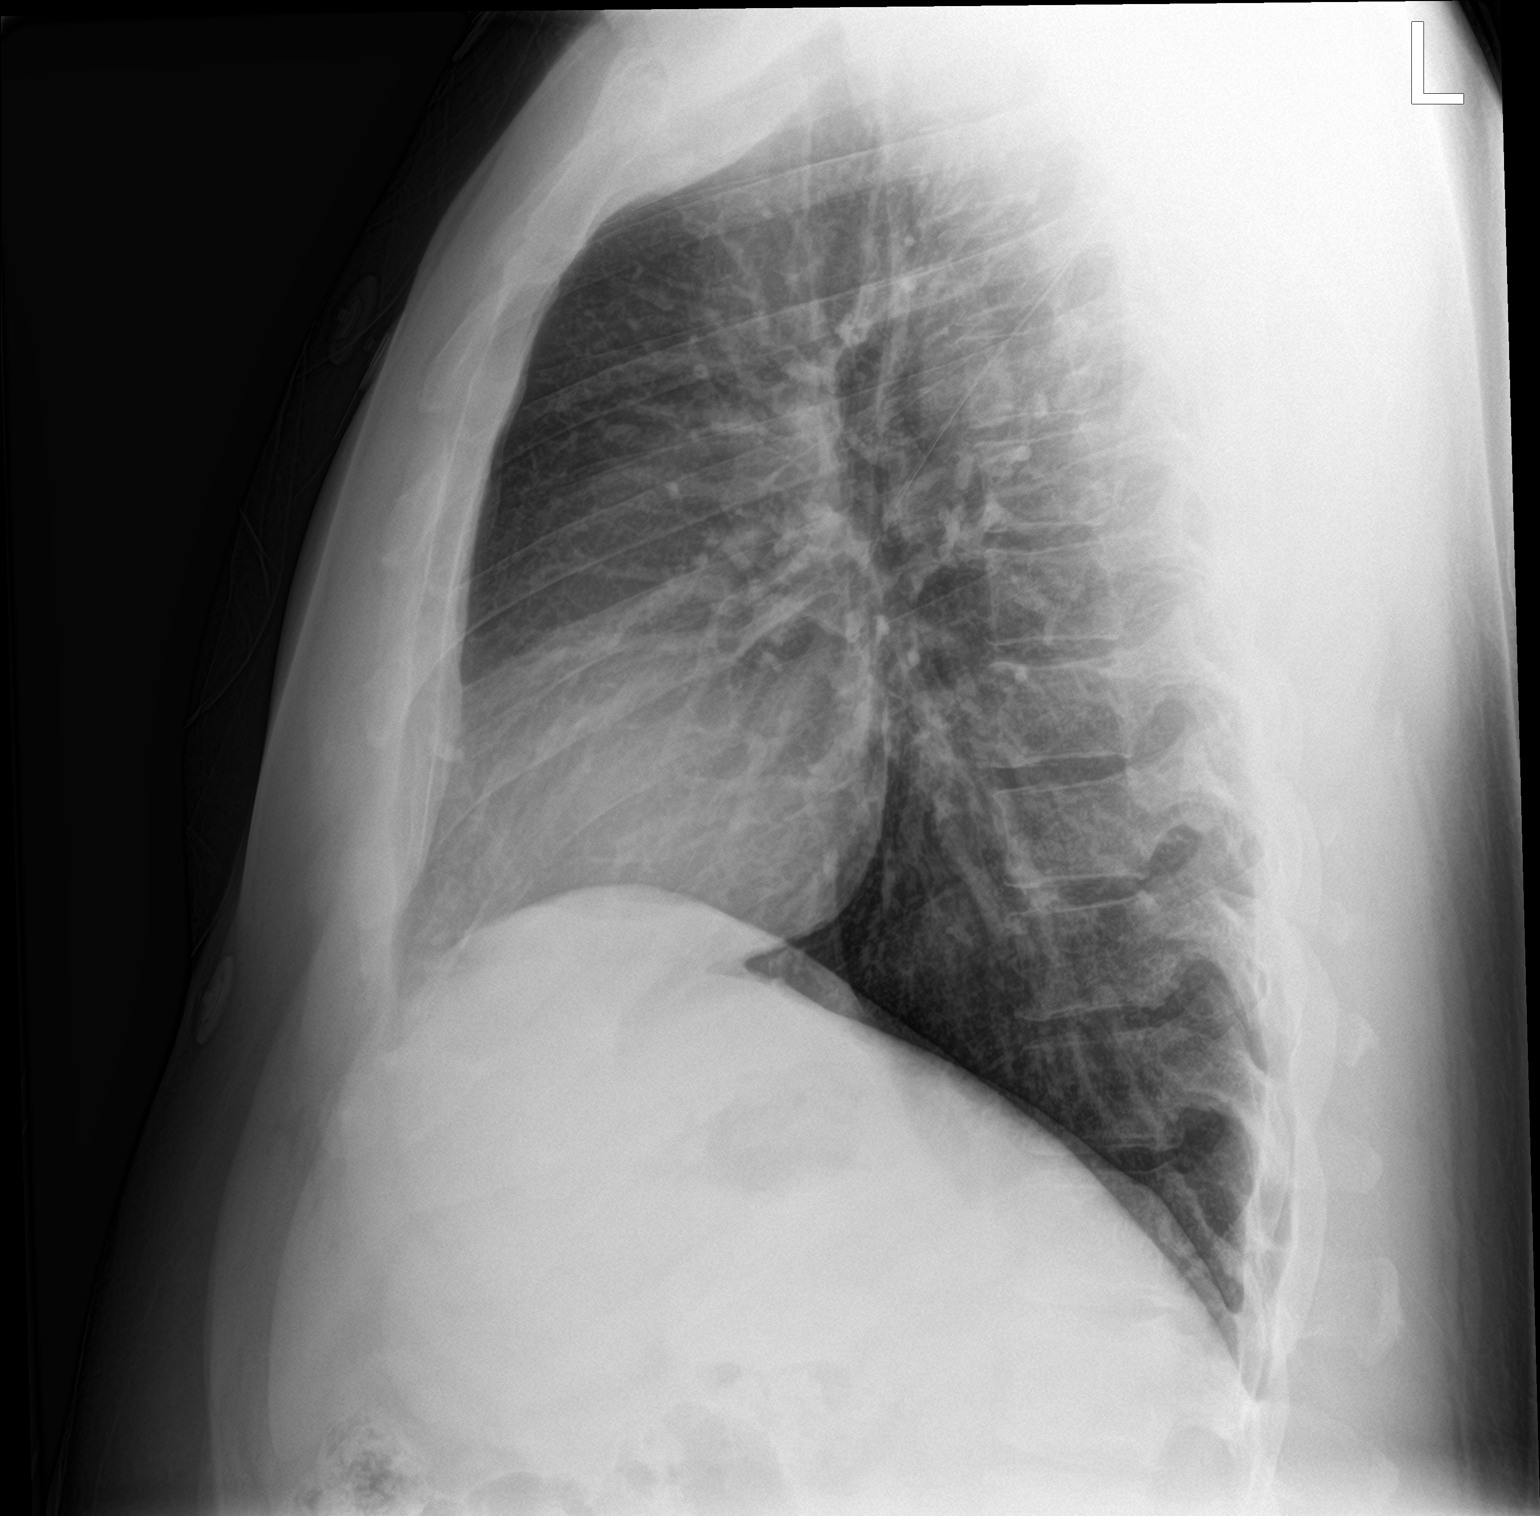

[2 of 2 positions shown; findings below may reference images not displayed]

FINDINGS: The heart size and mediastinal contours are within normal limits.
The lungs are clear. No pneumothorax or pleural effusion. The
visualized skeletal structures are unremarkable.
IMPRESSION: No active cardiopulmonary disease.
